# Patient Record
Sex: Male | Born: 1975 | Race: Black or African American | Marital: Married | Smoking: Never smoker
Health system: Southern US, Community
[De-identification: ages and names within clinical notes are randomized; demographics above are authoritative.]

---

## 2012-06-18 ENCOUNTER — Ambulatory Visit
Admission: RE | Admit: 2012-06-18 | Discharge: 2012-06-18 | Disposition: A | Discharge status: Home / Self Care | Payer: No Typology Code available for payment source | Source: Ambulatory Visit | Attending: Infectious Diseases | Admitting: Infectious Diseases

## 2012-06-18 ENCOUNTER — Other Ambulatory Visit: Payer: Self-pay | Admitting: Infectious Diseases

## 2012-06-18 DIAGNOSIS — R7611 Nonspecific reaction to tuberculin skin test without active tuberculosis: Secondary | ICD-10-CM

## 2015-12-06 ENCOUNTER — Ambulatory Visit: Payer: Self-pay

## 2015-12-07 ENCOUNTER — Ambulatory Visit (INDEPENDENT_AMBULATORY_CARE_PROVIDER_SITE_OTHER): Payer: BLUE CROSS/BLUE SHIELD | Admitting: Family Medicine

## 2015-12-07 VITALS — BP 122/72 | HR 74 | Temp 98.4°F | Resp 17 | Ht 72.0 in | Wt 159.0 lb

## 2015-12-07 DIAGNOSIS — J4521 Mild intermittent asthma with (acute) exacerbation: Secondary | ICD-10-CM

## 2015-12-07 MED ORDER — PREDNISONE 20 MG PO TABS: ORAL_TABLET | ORAL | 1 refills | Status: DC

## 2015-12-07 MED ORDER — ALBUTEROL SULFATE HFA 108 (90 BASE) MCG/ACT IN AERS: 2.0000 | INHALATION_SPRAY | RESPIRATORY_TRACT | 6 refills | Status: DC | PRN

## 2015-12-07 NOTE — Progress Notes (Signed)
This is a 40 year old man who comes in with tightness in his chest for 3 days. In the past he's had this kind of tightness this lasted an hour to put all resolves in short order. He's never been diagnosed with asthma and he does not smoke. He has not family history of asthma.    associated with a tightness in his chest is sneezing, sinus congestion, and itching in his eyes.  Patient denies any hemoptysis or fever.  Patient [place]  Objective:BP 122/72 (BP Location: Right Arm, Patient Position: Sitting, Cuff Size: Normal)   Pulse 74   Temp 98.4 F (36.9 C) (Oral)   Resp 17   Ht 6' (1.829 m)   Wt 159 lb (72.1 kg)   SpO2 95%   BMI 21.56 kg/m  No acute distress Sinus sounding voice HEENT: Unremarkable Chest: Bilateral expiratory and inspiratory wheezes. Heart: Regular no murmur Skin: Unremarkable  Assessment: Asthma, new onset. Mild and intermittent  Plan:  Asthma, mild intermittent, with acute exacerbation - Plan: predniSONE (DELTASONE) 20 MG tablet, albuterol (PROVENTIL HFA;VENTOLIN HFA) 108 (90 Base) MCG/ACT inhaler  Elvina SidleKurt Alesia Oshields, MD

## 2015-12-07 NOTE — Patient Instructions (Addendum)
   IF you received an x-ray today, you will receive an invoice from [facility]. Please contact [facility] at [phone] with questions or concerns regarding your invoice.   IF you received labwork today, you will receive an invoice from [facility]. Please contact [facility] at [phone] with questions or concerns regarding your invoice.   Our billing staff will not be able to assist you with questions regarding bills from these companies.  You will be contacted with the lab results as soon as they are available. The fastest way to get your results is to activate your My Chart account. Instructions are located on the last page of this paperwork. If you have not heard from us regarding the results in 2 weeks, please contact this office.     Asthma Attack Prevention While you may not be able to control the fact that you have asthma, you can take actions to prevent asthma attacks. The best way to prevent asthma attacks is to maintain good control of your asthma. You can achieve this by:  Taking your medicines as directed.  Avoiding things that can irritate your airways or make your asthma symptoms worse (asthma triggers).  Keeping track of how well your asthma is controlled and of any changes in your symptoms.  Responding quickly to worsening asthma symptoms (asthma attack).  Seeking emergency care when it is needed. WHAT ARE SOME WAYS TO PREVENT AN ASTHMA ATTACK? Have a Plan Work with your health care provider to create a written plan for managing and treating your asthma attacks (asthma action plan). This plan includes:  A list of your asthma triggers and how you can avoid them.  Information on when medicines should be taken and when their dosages should be changed.  The use of a device that measures how well your lungs are working (peak flow meter). Monitor Your Asthma Use your peak flow meter and record your results in a journal  every day. A drop in your peak flow numbers on one or more days may indicate the start of an asthma attack. This can happen even before you start to feel symptoms. You can prevent an asthma attack from getting worse by following the steps in your asthma action plan. Avoid Asthma Triggers Work with your asthma health care provider to find out what your asthma triggers are. This can be done by:  Allergy testing.  Keeping a journal that notes when asthma attacks occur and the factors that may have contributed to them.  Determining if there are other medical conditions that are making your asthma worse. Once you have determined your asthma triggers, take steps to avoid them. This may include avoiding excessive or prolonged exposure to:  Dust. Have someone dust and vacuum your home for you once or twice a week. Using a high-efficiency particulate arrestance (HEPA) vacuum is best.  Smoke. This includes campfire smoke, forest fire smoke, and secondhand smoke from tobacco products.  Pet dander. Avoid contact with animals that you know you are allergic to.  Allergens from trees, grasses or pollens. Avoid spending a lot of time outdoors when pollen counts are high, and on very windy days.  Very cold, dry, or humid air.  Mold.  Foods that contain high amounts of sulfites.  Strong odors.  Outdoor air pollutants, such as engine exhaust.  Indoor air pollutants, such as aerosol sprays and fumes from household cleaners.  Household pests, including dust mites and cockroaches, and pest droppings.  Certain medicines, including   NSAIDs. Always talk to your health care provider before stopping or starting any new medicines. Medicines Take over-the-counter and prescription medicines only as told by your health care provider. Many asthma attacks can be prevented by carefully following your medicine schedule. Taking your medicines correctly is especially important when you cannot avoid certain asthma  triggers. Act Quickly If an asthma attack does happen, acting quickly can decrease how severe it is and how long it lasts. Take these steps:   Pay attention to your symptoms. If you are coughing, wheezing, or having difficulty breathing, do not wait to see if your symptoms go away on their own. Follow your asthma action plan.  If you have followed your asthma action plan and your symptoms are not improving, call your health care provider or seek immediate medical care at the nearest hospital. It is important to note how often you need to use your fast-acting rescue inhaler. If you are using your rescue inhaler more often, it may mean that your asthma is not under control. Adjusting your asthma treatment plan may help you to prevent future asthma attacks and help you to gain better control of your condition. HOW CAN I PREVENT AN ASTHMA ATTACK WHEN I EXERCISE? Follow advice from your health care provider about whether you should use your fast-acting inhaler before exercising. Many people with asthma experience exercise-induced bronchoconstriction (EIB). This condition often worsens during vigorous exercise in cold, humid, or dry environments. Usually, people with EIB can stay very active by pre-treating with a fast-acting inhaler before exercising.   This information is not intended to replace advice given to you by your health care provider. Make sure you discuss any questions you have with your health care provider.   Document Released: 02/15/2009 Document Revised: 11/18/2014 Document Reviewed: 07/30/2014 Elsevier Interactive Patient Education 2016 Elsevier Inc.  

## 2016-01-16 ENCOUNTER — Other Ambulatory Visit: Payer: Self-pay | Admitting: Family Medicine

## 2016-01-16 DIAGNOSIS — J4521 Mild intermittent asthma with (acute) exacerbation: Secondary | ICD-10-CM

## 2016-01-24 ENCOUNTER — Ambulatory Visit (INDEPENDENT_AMBULATORY_CARE_PROVIDER_SITE_OTHER): Payer: BLUE CROSS/BLUE SHIELD | Admitting: Family Medicine

## 2016-01-24 ENCOUNTER — Ambulatory Visit (INDEPENDENT_AMBULATORY_CARE_PROVIDER_SITE_OTHER): Payer: BLUE CROSS/BLUE SHIELD

## 2016-01-24 VITALS — BP 142/84 | HR 80 | Temp 98.3°F | Resp 16 | Ht 72.0 in | Wt 161.8 lb

## 2016-01-24 DIAGNOSIS — J029 Acute pharyngitis, unspecified: Secondary | ICD-10-CM | POA: Diagnosis not present

## 2016-01-24 DIAGNOSIS — R0602 Shortness of breath: Secondary | ICD-10-CM

## 2016-01-24 DIAGNOSIS — R079 Chest pain, unspecified: Secondary | ICD-10-CM

## 2016-01-24 DIAGNOSIS — J209 Acute bronchitis, unspecified: Secondary | ICD-10-CM

## 2016-01-24 DIAGNOSIS — J4521 Mild intermittent asthma with (acute) exacerbation: Secondary | ICD-10-CM | POA: Diagnosis not present

## 2016-01-24 LAB — POCT CBC
GRANULOCYTE PERCENT: 59.8 % (ref 37–80)
HCT, POC: 45.2 % (ref 43.5–53.7)
Hemoglobin: 16.3 g/dL (ref 14.1–18.1)
Lymph, poc: 1.6 (ref 0.6–3.4)
MCH: 32.3 pg — AB (ref 27–31.2)
MCHC: 36.2 g/dL — AB (ref 31.8–35.4)
MCV: 89.2 fL (ref 80–97)
MID (CBC): 0.4 (ref 0–0.9)
MPV: 6.1 fL (ref 0–99.8)
PLATELET COUNT, POC: 223 10*3/uL (ref 142–424)
POC Granulocyte: 3.1 (ref 2–6.9)
POC LYMPH PERCENT: 31.6 %L (ref 10–50)
POC MID %: 8.6 % (ref 0–12)
RBC: 5.06 M/uL (ref 4.69–6.13)
RDW, POC: 12 %
WBC: 5.2 10*3/uL (ref 4.6–10.2)

## 2016-01-24 LAB — POCT RAPID STREP A (OFFICE): Rapid Strep A Screen: NEGATIVE

## 2016-01-24 LAB — POCT SEDIMENTATION RATE: POCT SED RATE: 25 mm/hr — AB (ref 0–22)

## 2016-01-24 MED ORDER — PREDNISONE 50 MG PO TABS: 50.0000 mg | ORAL_TABLET | Freq: Every day | ORAL | 0 refills | Status: DC

## 2016-01-24 MED ORDER — ALBUTEROL SULFATE (2.5 MG/3ML) 0.083% IN NEBU
2.5000 mg | INHALATION_SOLUTION | Freq: Once | RESPIRATORY_TRACT | Status: AC
  Administered 2016-01-24: 2.5 mg via RESPIRATORY_TRACT

## 2016-01-24 MED ORDER — AZITHROMYCIN 250 MG PO TABS: ORAL_TABLET | ORAL | 0 refills | Status: DC

## 2016-01-24 MED ORDER — ALBUTEROL SULFATE 108 (90 BASE) MCG/ACT IN AEPB: 2.0000 | INHALATION_SPRAY | RESPIRATORY_TRACT | 2 refills | Status: DC | PRN

## 2016-01-24 NOTE — Progress Notes (Signed)
Subjective:  By signing my name below, I, Jerry Sanchez, attest that this documentation has been prepared under the direction and in the presence of Jerry SorensonEva Shaw, MD Electronically Signed: Charline BillsEssence Sanchez, ED Scribe 01/24/2016 at 10:32 AM.   Patient ID: Jerry MatteInnocent Sanchez, male    DOB: Dec 21, 1975, 40 y.o.   MRN: [id number]  Chief Complaint  Patient presents with  . Cough    last night    HPI HPI Comments: Jerry Sanchez is a 40 y.o. male who presents to the [facility] complaining of dry cough onset last night. Pt was seen here 6 weeks prior for chest tightness. No prior or family h/o asthma. No h/o tobacco use. Diagnosed with mild intermittent asthma. Treated with prednisone and albuterol.   Today, pt reports dry cough onset last night. Pt reports associated symptoms of wheezing, left-sided and central chest tightness that is exacerbated with coughing. He states "I feel like I can't get a deep breath". No treatments tried PTA. Pt states that he never got the albuterol inhaler filled in September and that the Prednisone only provided temporary relief. He denies fever, chills, nasal congestion, sore throat, nausea, diaphoresis. Pt also denies smoking in his home.   No past medical history on file. Current Outpatient Prescriptions on File Prior to Visit  Medication Sig Dispense Refill  . albuterol (PROVENTIL HFA;VENTOLIN HFA) 108 (90 Base) MCG/ACT inhaler Inhale 2 puffs into the lungs every 4 (four) hours as needed for wheezing or shortness of breath (cough, shortness of breath or wheezing.). (Patient not taking: Reported on 01/24/2016) 1 Inhaler 6  . predniSONE (DELTASONE) 20 MG tablet Two daily with food (Patient not taking: Reported on 01/24/2016) 10 tablet 1   No current facility-administered medications on file prior to visit.    Not on File  Review of Systems  Constitutional: Negative for chills, diaphoresis and fever.  HENT: Negative for congestion and sore throat.     Respiratory: Positive for cough, chest tightness and wheezing.   Gastrointestinal: Negative for nausea.   BP (!) 142/84 (BP Location: Right Arm, Patient Position: Sitting, Cuff Size: Small)   Pulse 80   Temp 98.3 F (36.8 C) (Oral)   Resp 16   Ht 6' (1.829 m)   Wt 161 lb 12.8 oz (73.4 kg)   SpO2 96%   BMI 21.94 kg/m     Objective:   Physical Exam  Constitutional: He is oriented to person, place, and time. He appears well-developed and well-nourished. No distress.  HENT:  Head: Normocephalic and atraumatic.  Right Ear: Tympanic membrane normal.  Left Ear: Tympanic membrane normal.  Nose: Nose normal.  Mouth/Throat: Posterior oropharyngeal erythema present.  Petechiae on soft palate  Eyes: Conjunctivae and EOM are normal.  Neck: Neck supple. No tracheal deviation present.  Cardiovascular: Normal rate, regular rhythm, S1 normal, S2 normal and normal heart sounds.   Pulmonary/Chest: Effort normal. No respiratory distress. He has wheezes.  Very poor air movement. Very restricted with expiratory wheeze worse at R lower lobe.   Air movement significantly improved and lungs cleared after albuterol treatment.   Musculoskeletal: Normal range of motion.  Lymphadenopathy:    He has no cervical adenopathy.  Neurological: He is alert and oriented to person, place, and time.  Skin: Skin is warm and dry.  Psychiatric: He has a normal mood and affect. His behavior is normal.  Nursing note and vitals reviewed.  EKG: Normal sinus rhythm. RR prime throughout all chest leads. S wave present  in G3 and aVF.   Dg Chest 2 View  Result Date: 01/24/2016 CLINICAL DATA:  Cough, shortness of breath beginning last night. EXAM: CHEST  2 VIEW COMPARISON:  06/18/2012 FINDINGS: There is peribronchial thickening. Heart and mediastinal contours are within normal limits. No focal opacities or effusions. No acute bony abnormality. IMPRESSION: Bronchitic changes. Electronically Signed   By: Charlett NoseKevin  Dover M.D.    On: 01/24/2016 10:59   Results for orders placed or performed in visit on 01/24/16  POCT CBC  Result Value Ref Range   WBC 5.2 4.6 - 10.2 K/uL   Lymph, poc 1.6 0.6 - 3.4   POC LYMPH PERCENT 31.6 10 - 50 %L   MID (cbc) 0.4 0 - 0.9   POC MID % 8.6 0 - 12 %M   POC Granulocyte 3.1 2 - 6.9   Granulocyte percent 59.8 37 - 80 %G   RBC 5.06 4.69 - 6.13 M/uL   Hemoglobin 16.3 14.1 - 18.1 g/dL   HCT, POC 16.145.2 09.643.5 - 53.7 %   MCV 89.2 80 - 97 fL   MCH, POC 32.3 (A) 27 - 31.2 pg   MCHC 36.2 (A) 31.8 - 35.4 g/dL   RDW, POC 04.512.0 %   Platelet Count, POC 223 142 - 424 K/uL   MPV 6.1 0 - 99.8 fL  POCT rapid strep A  Result Value Ref Range   Rapid Strep A Screen Negative Negative      Assessment & Plan:   1. Chest pain, unspecified type   2. Shortness of breath   3. Pharyngitis, unspecified etiology   4. Acute bronchitis, unspecified organism   5. Mild intermittent reactive airway disease with acute exacerbation    Exam and sxs all resolved/normal after albuterol neb in office.  Spent >10 min teaching pt how to use albuterol inhaler - all questions answered but pt still wary.  Advised that he is welcome to RTC for nurse-only teaching visit to ensure he is using albuterol correctly if he would like.  Reminded pt that he may have cough for up to 3 wks but if he cont to have occ ShoB/wheezing then should RTC  For further eval with pre- and post-spirometry and/or consider pulmonology referral.  Orders Placed This Encounter  Procedures  . Culture, Group A Strep    Order Specific Question:   Source    Answer:   oropharynx  . DG Chest 2 View    Standing Status:   Future    Number of Occurrences:   1    Standing Expiration Date:   01/23/2017    Order Specific Question:   Reason for Exam (SYMPTOM  OR DIAGNOSIS REQUIRED)    Answer:   wheezing    Order Specific Question:   Preferred imaging location?    Answer:   External  . POCT CBC  . POCT SEDIMENTATION RATE  . POCT rapid strep A  . EKG  12-Lead    Meds ordered this encounter  Medications  . albuterol (PROVENTIL) (2.5 MG/3ML) 0.083% nebulizer solution 2.5 mg  . Albuterol Sulfate (PROAIR RESPICLICK) 108 (90 Base) MCG/ACT AEPB    Sig: Inhale 2 puffs into the lungs every 4 (four) hours as needed.    Dispense:  1 each    Refill:  2  . azithromycin (ZITHROMAX) 250 MG tablet    Sig: Take 2 tabs PO x 1 dose, then 1 tab PO QD x 4 days    Dispense:  6 tablet  Refill:  0  . predniSONE (DELTASONE) 50 MG tablet    Sig: Take 1 tablet (50 mg total) by mouth daily.    Dispense:  5 tablet    Refill:  0    I personally performed the services described in this documentation, which was scribed in my presence. The recorded information has been reviewed and considered, and addended by me as needed.   Jerry Sanchez, M.D.  [facility] [address] [place], [place] [address] [phone] phone [phone] fax  01/24/16 11:46 AM

## 2016-01-24 NOTE — Patient Instructions (Addendum)
   IF you received an x-ray today, you will receive an invoice from [facility]. Please contact  Radiology at [phone] with questions or concerns regarding your invoice.   IF you received labwork today, you will receive an invoice from [facility]. Please contact [facility] at [phone] with questions or concerns regarding your invoice.   Our billing staff will not be able to assist you with questions regarding bills from these companies.  You will be contacted with the lab results as soon as they are available. The fastest way to get your results is to activate your My Chart account. Instructions are located on the last page of this paperwork. If you have not heard from us regarding the results in 2 weeks, please contact this office.    Acute Bronchitis Bronchitis is inflammation of the airways that extend from the windpipe into the lungs (bronchi). The inflammation often causes mucus to develop. This leads to a cough, which is the most common symptom of bronchitis.  In acute bronchitis, the condition usually develops suddenly and goes away over time, usually in a couple weeks. Smoking, allergies, and asthma can make bronchitis worse. Repeated episodes of bronchitis may cause further lung problems.  CAUSES Acute bronchitis is most often caused by the same virus that causes a cold. The virus can spread from person to person (contagious) through coughing, sneezing, and touching contaminated objects. SIGNS AND SYMPTOMS   Cough.   Fever.   Coughing up mucus.   Body aches.   Chest congestion.   Chills.   Shortness of breath.   Sore throat.  DIAGNOSIS  Acute bronchitis is usually diagnosed through a physical exam. Your health care provider will also ask you questions about your medical history. Tests, such as chest X-rays, are sometimes done to rule out other conditions.  TREATMENT  Acute bronchitis usually goes away in a  couple weeks. Oftentimes, no medical treatment is necessary. Medicines are sometimes given for relief of fever or cough. Antibiotic medicines are usually not needed but may be prescribed in certain situations. In some cases, an inhaler may be recommended to help reduce shortness of breath and control the cough. A cool mist vaporizer may also be used to help thin bronchial secretions and make it easier to clear the chest.  HOME CARE INSTRUCTIONS  Get plenty of rest.   Drink enough fluids to keep your urine clear or pale yellow (unless you have a medical condition that requires fluid restriction). Increasing fluids may help thin your respiratory secretions (sputum) and reduce chest congestion, and it will prevent dehydration.   Take medicines only as directed by your health care provider.  If you were prescribed an antibiotic medicine, finish it all even if you start to feel better.  Avoid smoking and secondhand smoke. Exposure to cigarette smoke or irritating chemicals will make bronchitis worse. If you are a smoker, consider using nicotine gum or skin patches to help control withdrawal symptoms. Quitting smoking will help your lungs heal faster.   Reduce the chances of another bout of acute bronchitis by washing your hands frequently, avoiding people with cold symptoms, and trying not to touch your hands to your mouth, nose, or eyes.   Keep all follow-up visits as directed by your health care provider.  SEEK MEDICAL CARE IF: Your symptoms do not improve after 1 week of treatment.  SEEK IMMEDIATE MEDICAL CARE IF:  You develop an increased fever or chills.   You have chest pain.     You have severe shortness of breath.  You have bloody sputum.   You develop dehydration.  You faint or repeatedly feel like you are going to pass out.  You develop repeated vomiting.  You develop a severe headache. MAKE SURE YOU:   Understand these instructions.  Will watch your  condition.  Will get help right away if you are not doing well or get worse.   This information is not intended to replace advice given to you by your health care provider. Make sure you discuss any questions you have with your health care provider.   Document Released: 04/06/2004 Document Revised: 03/20/2014 Document Reviewed: 08/20/2012 Elsevier Interactive Patient Education 2016 Elsevier Inc.  

## 2016-01-25 LAB — CULTURE, GROUP A STREP: ORGANISM ID, BACTERIA: NORMAL

## 2017-01-04 IMAGING — DX DG CHEST 2V
2 series · 2 of 2 positions shown · non-contrast
Comparison: 06/18/2012

CLINICAL DATA: Cough, shortness of breath beginning last night.

EXAM:
CHEST  2 VIEW

[chest pa]
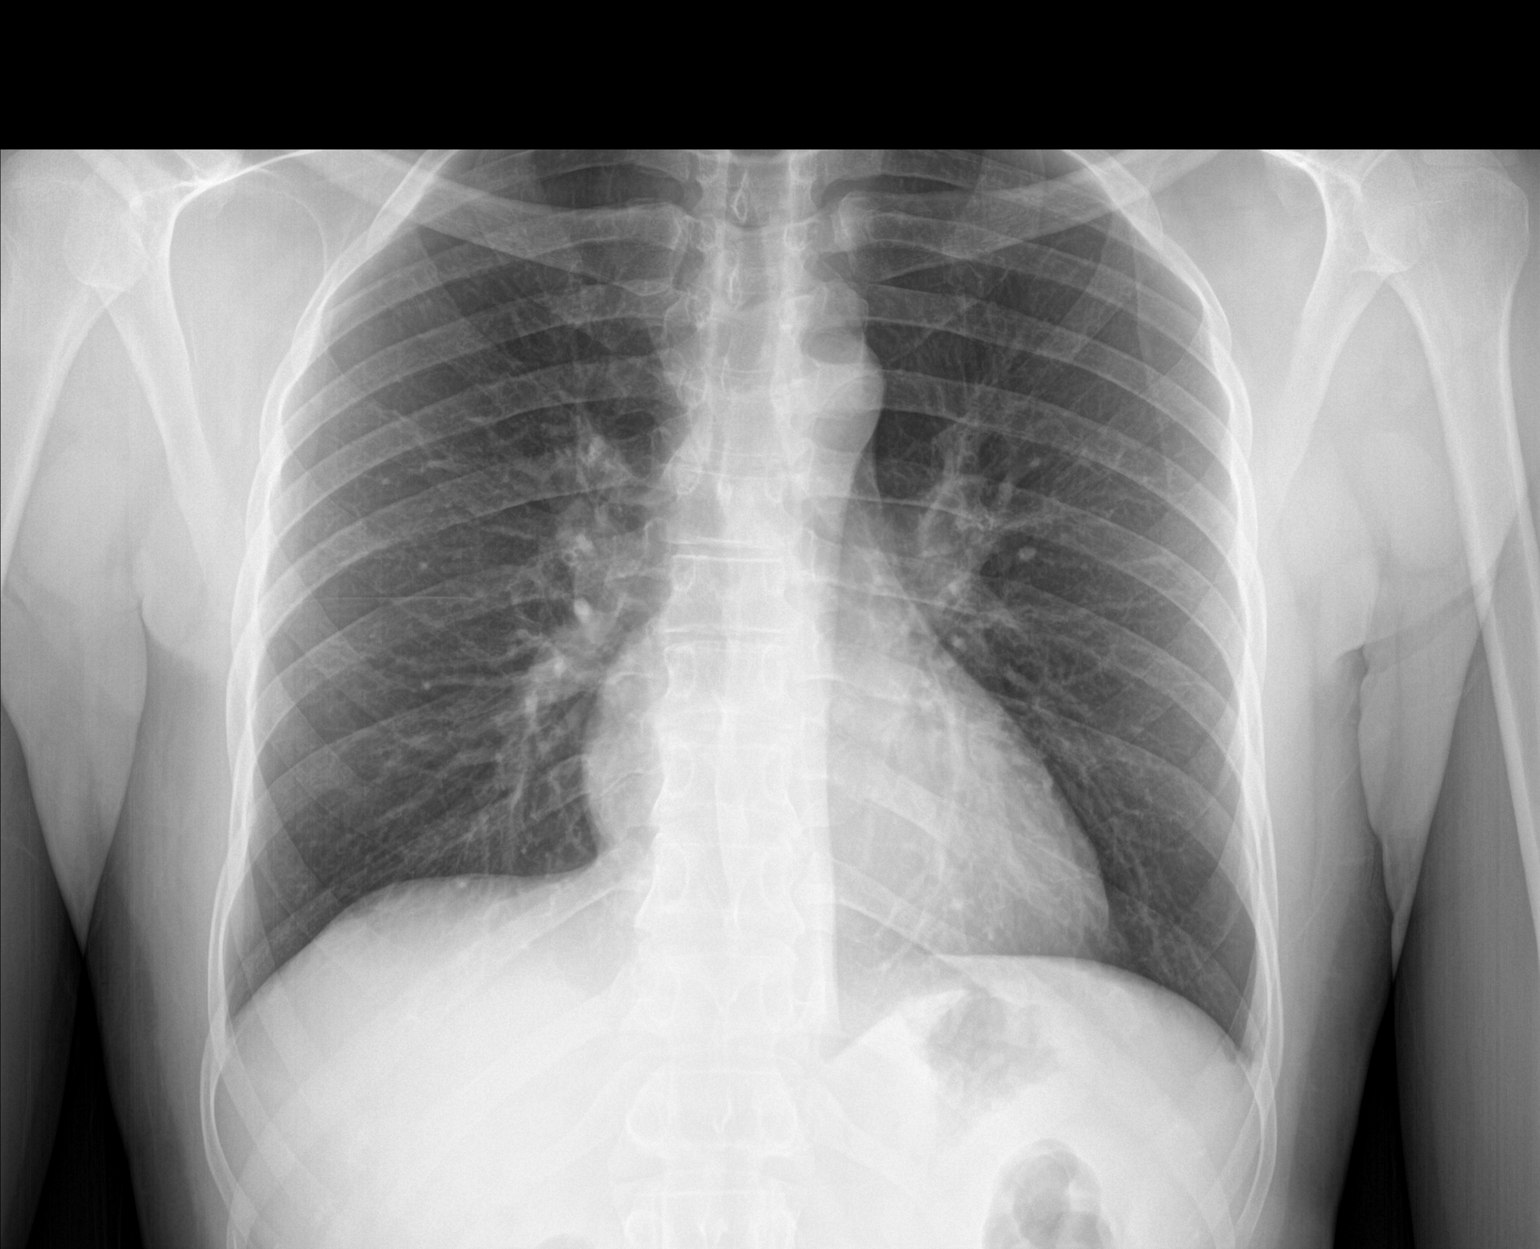

[chest lat]
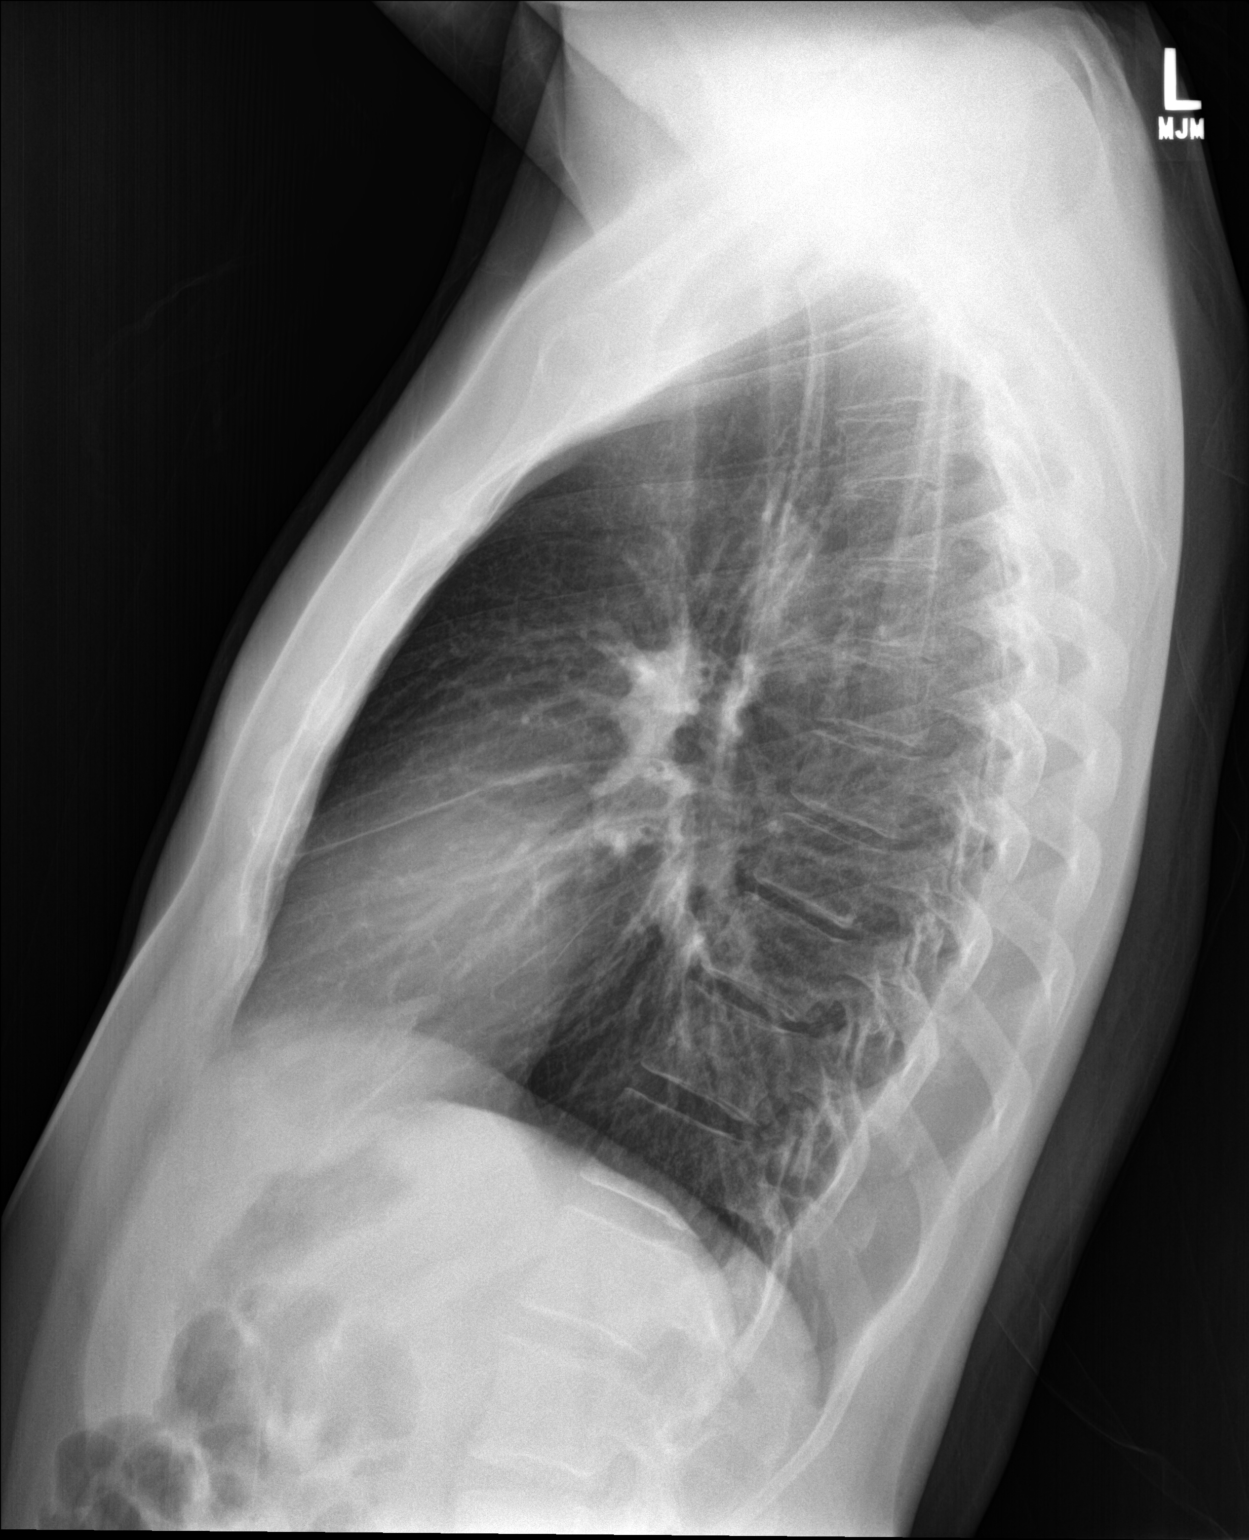

[2 of 2 positions shown; findings below may reference images not displayed]

FINDINGS: There is peribronchial thickening. Heart and mediastinal contours
are within normal limits. No focal opacities or effusions. No acute
bony abnormality.
IMPRESSION: Bronchitic changes.

## 2017-06-29 ENCOUNTER — Other Ambulatory Visit: Payer: Self-pay

## 2017-06-29 ENCOUNTER — Ambulatory Visit: Payer: BC Managed Care – PPO | Admitting: Physician Assistant

## 2017-06-29 ENCOUNTER — Encounter: Payer: Self-pay | Admitting: Physician Assistant

## 2017-06-29 VITALS — BP 108/62 | HR 90 | Temp 98.6°F | Resp 16 | Ht 74.0 in | Wt 172.8 lb

## 2017-06-29 DIAGNOSIS — R079 Chest pain, unspecified: Secondary | ICD-10-CM | POA: Diagnosis not present

## 2017-06-29 MED ORDER — ALBUTEROL SULFATE 108 (90 BASE) MCG/ACT IN AEPB: 2.0000 | INHALATION_SPRAY | RESPIRATORY_TRACT | 2 refills | Status: DC | PRN

## 2017-06-29 NOTE — Progress Notes (Signed)
Patient ID: Jerry Sanchez, male    DOB: 1975-10-19, 42 y.o.   MRN: [id number]  PCP: Patient, No Pcp Per  Chief Complaint  Patient presents with  . Chest Pain    started last year and started a week ago     Subjective:   Presents for evaluation of chest pain.  This pain occurs at night, and is associated with sensation that he cannot take a deep breath. Intermittent, doesn't occur every night. Symptoms x 2 weeks.  He had similar symptoms in fall 2017. Notes 11/2015 and 01/2016 reviewed.  He was initially diagnosed with asthma and prescribed an oral steroid taper and albuterol inhaler. Upon return, he reported no improvement with the prednisone and had not used the albuterol.  In the office, albuterol neb resolved the symptoms, and he was educated on inhaler use, advised to use it, and prescribed azithromycin. His symptoms resolved until 2 weeks ago.  Not a smoker, ever. No nasal or sinus symptoms. No sore throat, ear popping or pain. No fever, chills. No GI/GU symptoms.    Review of Systems Constitutional: Negative for chills, diaphoresis, fatigue and fever.  HENT: Negative for congestion, postnasal drip, rhinorrhea, sinus pressure, sinus pain, sneezing, sore throat and voice change.   Eyes: Negative for pain, discharge and itching.  Respiratory: Positive for chest tightness and wheezing (Only PM). Negative for cough, choking, shortness of breath and stridor.   Cardiovascular: Positive for chest pain (intermittent). Negative for palpitations and leg swelling.  Gastrointestinal: Negative.   Endocrine: Negative.   Genitourinary: Negative.   Musculoskeletal: Negative.   Skin: Negative.   Allergic/Immunologic: Positive for environmental allergies.  Neurological: Negative.  Negative for dizziness and light-headedness.  Hematological: Negative.   Psychiatric/Behavioral: Negative.  The patient is not nervous/anxious.        There are no active problems to display for this  patient.    Prior to Admission medications   Medication Sig Start Date End Date Taking? Authorizing Provider  Albuterol Sulfate (PROAIR RESPICLICK) 108 (90 Base) MCG/ACT AEPB Inhale 2 puffs into the lungs every 4 (four) hours as needed. Patient not taking: Reported on 06/29/2017 01/24/16   Sherren MochaShaw, Eva N, MD  azithromycin (ZITHROMAX) 250 MG tablet Take 2 tabs PO x 1 dose, then 1 tab PO QD x 4 days Patient not taking: Reported on 06/29/2017 01/24/16   Sherren MochaShaw, Eva N, MD  predniSONE (DELTASONE) 50 MG tablet Take 1 tablet (50 mg total) by mouth daily. Patient not taking: Reported on 06/29/2017 01/24/16   Sherren MochaShaw, Eva N, MD     No Known Allergies     Objective:  Physical Exam  Constitutional: He is oriented to person, place, and time. He appears well-developed and well-nourished. He is active and cooperative. No distress.  BP 108/62   Pulse 90   Temp 98.6 F (37 C)   Resp 16   Ht 6\' 2"  (1.88 m)   Wt 172 lb 12.8 oz (78.4 kg)   SpO2 96%   BMI 22.19 kg/m   HENT:  Head: Normocephalic and atraumatic.  Right Ear: Hearing, tympanic membrane, external ear and ear canal normal.  Left Ear: Hearing, tympanic membrane, external ear and ear canal normal.  Nose: Nose normal.  Eyes: Conjunctivae and lids are normal. No scleral icterus.  Neck: Normal range of motion and phonation normal. Neck supple. No thyromegaly present.  Cardiovascular: Normal rate, regular rhythm and normal heart sounds.  Pulses:      Radial pulses are 2+  on the right side, and 2+ on the left side.  Pulmonary/Chest: Effort normal and breath sounds normal.  Abdominal: Soft. Normal appearance and bowel sounds are normal. There is no tenderness.  Lymphadenopathy:       Head (right side): No tonsillar, no preauricular, no posterior auricular and no occipital adenopathy present.       Head (left side): No tonsillar, no preauricular, no posterior auricular and no occipital adenopathy present.    He has no cervical adenopathy.        Right: No supraclavicular adenopathy present.       Left: No supraclavicular adenopathy present.  Neurological: He is alert and oriented to person, place, and time. No sensory deficit.  Skin: Skin is warm, dry and intact. No rash noted. No cyanosis or erythema. Nails show no clubbing.  Psychiatric: He has a normal mood and affect. His speech is normal and behavior is normal.           Assessment & Plan:   1. Chest pain, unspecified type No evidence of bacterial infection. Previous evaluation found no other cause for symptoms, which resolved in the office with albuterol neb treatment. Resume albuterol, and re-evaluate in 4 weeks. If needing it regularly, would recommend inhaled steroid. - Albuterol Sulfate (PROAIR RESPICLICK) 108 (90 Base) MCG/ACT AEPB; Inhale 2 puffs into the lungs every 4 (four) hours as needed.  Dispense: 1 each; Refill: 2    Return in about 1 month (around 07/27/2017), or if symptoms worsen or fail to improve, for re-evalaution of chest pain.   Fernande Bras, PA-C [facility]

## 2017-06-29 NOTE — Progress Notes (Signed)
Subjective:    Patient ID: Jerry Sanchez, male    DOB: 02/07/1976, 42 y.o.   MRN: [id number] Chief Complaint  Patient presents with  . Chest Pain    started last year and started a week ago     HPI  Jerry Sanchez, 42 yo male presents today for 1 week of intermittent pain with inspiration. Chest pain is central, hurts only with a deep breath and is described as a chest tightness feeling. Pain started 1 week ago. Wheezing and pain only occurs mostly at night.  Denies chest pain at rest, jaw pain, arm pain, SOB, cough, fevers, chills. Denies chest pain with movement or exertion.  Denies smoking, hemoptysis.  Pt reports coming here last year with the same complaint and was diagnosed with asthma. Reports he has the same pain today as last year. Upon chart review, it appears patient has had asthma attacks in the past (01/2016) and was treated with nebulizer in office, with relief.   Review of Systems  Constitutional: Negative for chills, diaphoresis, fatigue and fever.  HENT: Negative for congestion, postnasal drip, rhinorrhea, sinus pressure, sinus pain, sneezing, sore throat and voice change.   Eyes: Negative for pain, discharge and itching.  Respiratory: Positive for chest tightness and wheezing (Only PM). Negative for cough, choking, shortness of breath and stridor.   Cardiovascular: Positive for chest pain (intermittent). Negative for palpitations and leg swelling.  Gastrointestinal: Negative.   Endocrine: Negative.   Genitourinary: Negative.   Musculoskeletal: Negative.   Skin: Negative.   Allergic/Immunologic: Positive for environmental allergies.  Neurological: Negative.  Negative for dizziness and light-headedness.  Hematological: Negative.   Psychiatric/Behavioral: Negative.  The patient is not nervous/anxious.     There are no active problems to display for this patient.   History reviewed. No pertinent past medical history.  Prior to Admission medications   Medication  Sig Start Date End Date Taking? Authorizing Provider  Albuterol Sulfate (PROAIR RESPICLICK) 108 (90 Base) MCG/ACT AEPB Inhale 2 puffs into the lungs every 4 (four) hours as needed. Patient not taking: Reported on 06/29/2017 01/24/16   Sherren Mocha, MD    No Known Allergies     Objective:   Physical Exam  Constitutional: He is oriented to person, place, and time. He appears well-developed and well-nourished. No distress.  BP 108/62   Pulse 90   Temp 98.6 F (37 C)   Resp 16   Ht 6\' 2"  (1.88 m)   Wt 172 lb 12.8 oz (78.4 kg)   SpO2 96%   BMI 22.19 kg/m    HENT:  Head: Normocephalic and atraumatic.  Nose: Nose normal.  Eyes: Pupils are equal, round, and reactive to light. Conjunctivae and EOM are normal. Right eye exhibits no discharge. Left eye exhibits no discharge.  Neck: Normal range of motion. Neck supple. No tracheal deviation present. No thyromegaly present.  Cardiovascular: Normal rate, regular rhythm and intact distal pulses. Exam reveals no gallop and no friction rub.  No murmur heard. Pulses:      Radial pulses are 2+ on the right side, and 2+ on the left side.       Dorsalis pedis pulses are 2+ on the right side, and 2+ on the left side.       Posterior tibial pulses are 2+ on the right side, and 2+ on the left side.  Pulmonary/Chest: Effort normal and breath sounds normal. No respiratory distress. He has no wheezes. He has no rales.  Musculoskeletal: Normal range  of motion. He exhibits no edema.  Neurological: He is alert and oriented to person, place, and time. He has normal reflexes.  Skin: Skin is warm and dry. He is not diaphoretic. No erythema.  Psychiatric: He has a normal mood and affect. His behavior is normal.          Assessment & Plan:  Chest pain, unspecified type - Plan: Albuterol Sulfate (PROAIR RESPICLICK) 108 (90 Base) MCG/ACT AEPB informed to return if symptoms worsen or do not get better. Only 1 albuterol prescribed, explained to patient would  like to see him back if he is using inhaler too often. Unlikely bacterial or infectious in nature.   Return in about 1 month (around 07/27/2017), or if symptoms worsen or fail to improve, for re-evalaution of chest pain.

## 2017-06-29 NOTE — Patient Instructions (Addendum)
Use the albuterol inhaler 2 puffs every 4-6 hours as needed for cough, shortness of breath, wheezing, chest tightness.    IF you received an x-ray today, you will receive an invoice from [facility]. Please contact [facility] at [phone] with questions or concerns regarding your invoice.   IF you received labwork today, you will receive an invoice from [facility]. Please contact [facility] at [phone] with questions or concerns regarding your invoice.   Our billing staff will not be able to assist you with questions regarding bills from these companies.  You will be contacted with the lab results as soon as they are available. The fastest way to get your results is to activate your My Chart account. Instructions are located on the last page of this paperwork. If you have not heard from us regarding the results in 2 weeks, please contact this office.

## 2020-02-28 ENCOUNTER — Encounter (HOSPITAL_COMMUNITY): Payer: Self-pay | Admitting: Emergency Medicine

## 2020-02-28 ENCOUNTER — Emergency Department (HOSPITAL_COMMUNITY)
Admission: EM | Admit: 2020-02-28 | Discharge: 2020-02-29 | Disposition: A | Discharge status: Home / Self Care | Payer: BC Managed Care – PPO | Attending: Emergency Medicine | Admitting: Emergency Medicine

## 2020-02-28 ENCOUNTER — Other Ambulatory Visit: Payer: Self-pay

## 2020-02-28 DIAGNOSIS — S81851A Open bite, right lower leg, initial encounter: Secondary | ICD-10-CM | POA: Diagnosis present

## 2020-02-28 DIAGNOSIS — Z23 Encounter for immunization: Secondary | ICD-10-CM | POA: Insufficient documentation

## 2020-02-28 DIAGNOSIS — Z2914 Encounter for prophylactic rabies immune globin: Secondary | ICD-10-CM | POA: Diagnosis not present

## 2020-02-28 DIAGNOSIS — W540XXA Bitten by dog, initial encounter: Secondary | ICD-10-CM | POA: Diagnosis not present

## 2020-02-28 DIAGNOSIS — Y9301 Activity, walking, marching and hiking: Secondary | ICD-10-CM | POA: Insufficient documentation

## 2020-02-28 NOTE — ED Triage Notes (Addendum)
Pt reports dog bite to R lower leg.  States he was walking and unknown dog bit him just PTA.  Bleeding controlled.

## 2020-02-29 MED ORDER — AMOXICILLIN-POT CLAVULANATE 875-125 MG PO TABS: 1.0000 | ORAL_TABLET | Freq: Two times a day (BID) | ORAL | 0 refills | Status: AC

## 2020-02-29 MED ORDER — AMOXICILLIN-POT CLAVULANATE 875-125 MG PO TABS
1.0000 | ORAL_TABLET | Freq: Once | ORAL | Status: AC
Start: 2020-02-29 — End: 2020-02-29
  Administered 2020-02-29: 1 via ORAL
  Filled 2020-02-29: qty 1

## 2020-02-29 MED ORDER — TETANUS-DIPHTH-ACELL PERTUSSIS 5-2.5-18.5 LF-MCG/0.5 IM SUSY
0.5000 mL | PREFILLED_SYRINGE | Freq: Once | INTRAMUSCULAR | Status: AC
  Administered 2020-02-29: 0.5 mL via INTRAMUSCULAR
  Filled 2020-02-29: qty 0.5

## 2020-02-29 MED ORDER — RABIES VACCINE, PCEC IM SUSR
1.0000 mL | Freq: Once | INTRAMUSCULAR | Status: AC
  Administered 2020-02-29: 06:00:00 1 mL via INTRAMUSCULAR
  Filled 2020-02-29: qty 1

## 2020-02-29 MED ORDER — RABIES IMMUNE GLOBULIN 150 UNIT/ML IM INJ
1500.0000 [IU] | INJECTION | Freq: Once | INTRAMUSCULAR | Status: AC
  Administered 2020-02-29: 06:00:00 1500 [IU]
  Filled 2020-02-29: qty 10

## 2020-02-29 NOTE — Discharge Instructions (Addendum)
Take Augmentin as prescribed until finished. Use tylenol or ibuprofen for pain control. Return on 03/03/20, 03/07/20, and 03/15/19 for additional rabies vaccines.  Return sooner for new or concerning symptoms.

## 2020-02-29 NOTE — ED Notes (Signed)
Pt wound to lower right leg cleaned; 2x2 gauze dressing applied; pt instructed to wash wound daily.

## 2020-02-29 NOTE — ED Provider Notes (Signed)
[facility] EMERGENCY DEPARTMENT Provider Note   CSN: [id number] Arrival date & time: 02/28/20  1738     History Chief Complaint  Patient presents with  . Animal Bite    Jerry Sanchez is a 44 y.o. male.  44 year old male presents to the emergency department for evaluation of dog bite.  This occurred just prior to arrival yesterday at 1830.  Patient states that he was walking when a dog, unknown to him, came up and bit him.  He is unsure of who the dog belongs to.  Bleeding of the wound controlled prior to arrival without significant intervention.  Has mild soreness at the bite site.  Cannot recall the date of his last tetanus shot.       History reviewed. No pertinent past medical history.  There are no problems to display for this patient.   History reviewed. No pertinent surgical history.     No family history on file.  Social History   Tobacco Use  . Smoking status: Never Smoker  . Smokeless tobacco: Never Used  Substance Use Topics  . Alcohol use: No  . Drug use: No    Home Medications Prior to Admission medications   Medication Sig Start Date End Date Taking? Authorizing Provider  Albuterol Sulfate (PROAIR RESPICLICK) 108 (90 Base) MCG/ACT AEPB Inhale 2 puffs into the lungs every 4 (four) hours as needed. 06/29/17   Porfirio Oar, PA  amoxicillin-clavulanate (AUGMENTIN) 875-125 MG tablet Take 1 tablet by mouth every 12 (twelve) hours. 02/29/20   Antony Madura, PA-C    Allergies    Patient has no known allergies.  Review of Systems   Review of Systems  Ten systems reviewed and are negative for acute change, except as noted in the HPI.    Physical Exam Updated Vital Signs BP 132/82   Pulse 68   Temp 98.4 F (36.9 C) (Oral)   Resp 18   Ht 6\' 2"  (1.88 m)   Wt 78.5 kg   SpO2 100%   BMI 22.21 kg/m   Physical Exam Vitals and nursing note reviewed.  Constitutional:      General: He is not in acute distress.    Appearance: He  is well-developed and well-nourished. He is not diaphoretic.     Comments: Nontoxic appearing and in NAD  HENT:     Head: Normocephalic and atraumatic.  Eyes:     General: No scleral icterus.    Extraocular Movements: EOM normal.     Conjunctiva/sclera: Conjunctivae normal.  Pulmonary:     Effort: Pulmonary effort is normal. No respiratory distress.     Comments: Respirations even and unlabored Musculoskeletal:        General: Normal range of motion.     Cervical back: Normal range of motion.     Comments: Bite wound to proximal, lateral R calf. No active bleeding. Compartments of the RLE soft. No foreign body identified or palpated.  Skin:    General: Skin is warm and dry.     Coloration: Skin is not pale.     Findings: No erythema or rash.  Neurological:     Mental Status: He is alert and oriented to person, place, and time.  Psychiatric:        Mood and Affect: Mood and affect normal.        Behavior: Behavior normal.     ED Results / Procedures / Treatments   Labs (all labs ordered are listed, but only abnormal results are  displayed) Labs Reviewed - No data to display  EKG None  Radiology No results found.  Procedures Procedures (including critical care time)  Medications Ordered in ED Medications  rabies vaccine (RABAVERT) injection 1 mL (has no administration in time range)  rabies immune globulin (HYPERAB/KEDRAB) injection 1,500 Units (has no administration in time range)  amoxicillin-clavulanate (AUGMENTIN) 875-125 MG per tablet 1 tablet (1 tablet Oral Given 02/29/20 0455)  Tdap (BOOSTRIX) injection 0.5 mL (0.5 mLs Intramuscular Given 02/29/20 0455)    ED Course  I have reviewed the triage vital signs and the nursing notes.  Pertinent labs & imaging results that were available during my care of the patient were reviewed by me and considered in my medical decision making (see chart for details).    MDM Rules/Calculators/A&P                           44 year old male presenting with dog bite to the right lateral lower extremity.  Wound cleaned, dressed.  Tetanus updated in the emergency department and patient started on Augmentin.  Given that this dog appears to have been a stray and is unable to be relocated, patient was started on rabies vaccine.  Instructed to return on day 3, 7, 14 for additional vaccinations.  Can continue with Tylenol or ibuprofen for pain control.  Discharged in stable condition.   Final Clinical Impression(s) / ED Diagnoses Final diagnoses:  Dog bite of right lower leg, initial encounter    Rx / DC Orders ED Discharge Orders         Ordered    amoxicillin-clavulanate (AUGMENTIN) 875-125 MG tablet  Every 12 hours        02/29/20 0458           Antony Madura, PA-C 02/29/20 0505    Ward, Layla Maw, DO 02/29/20 [phone]

## 2020-03-02 ENCOUNTER — Ambulatory Visit (HOSPITAL_COMMUNITY)
Admission: EM | Admit: 2020-03-02 | Discharge: 2020-03-02 | Disposition: A | Discharge status: Home / Self Care | Payer: BC Managed Care – PPO | Attending: Internal Medicine | Admitting: Internal Medicine

## 2020-03-02 DIAGNOSIS — Z203 Contact with and (suspected) exposure to rabies: Secondary | ICD-10-CM | POA: Diagnosis not present

## 2020-03-02 MED ORDER — RABIES VACCINE, PCEC IM SUSR
INTRAMUSCULAR | Status: AC
  Filled 2020-03-02: qty 1

## 2020-03-02 MED ORDER — RABIES VACCINE, PCEC IM SUSR
1.0000 mL | Freq: Once | INTRAMUSCULAR | Status: AC
  Administered 2020-03-02: 13:00:00 1 mL via INTRAMUSCULAR

## 2020-03-02 NOTE — ED Triage Notes (Signed)
Pt presents for 2nd series of rabies vaccines. Denies any pain or discomfort at wound site.

## 2020-03-07 ENCOUNTER — Ambulatory Visit (HOSPITAL_COMMUNITY)
Admission: EM | Admit: 2020-03-07 | Discharge: 2020-03-07 | Disposition: A | Discharge status: Home / Self Care | Payer: BC Managed Care – PPO | Attending: Urgent Care | Admitting: Urgent Care

## 2020-03-07 ENCOUNTER — Encounter (HOSPITAL_COMMUNITY): Payer: Self-pay

## 2020-03-07 ENCOUNTER — Other Ambulatory Visit: Payer: Self-pay

## 2020-03-07 DIAGNOSIS — Z203 Contact with and (suspected) exposure to rabies: Secondary | ICD-10-CM | POA: Diagnosis not present

## 2020-03-07 MED ORDER — RABIES VACCINE, PCEC IM SUSR
1.0000 mL | Freq: Once | INTRAMUSCULAR | Status: AC
  Administered 2020-03-07: 13:00:00 1 mL via INTRAMUSCULAR

## 2020-03-07 MED ORDER — RABIES VACCINE, PCEC IM SUSR
INTRAMUSCULAR | Status: AC
  Filled 2020-03-07: qty 1

## 2020-03-14 ENCOUNTER — Other Ambulatory Visit: Payer: Self-pay

## 2020-03-14 ENCOUNTER — Ambulatory Visit (HOSPITAL_COMMUNITY)
Admission: EM | Admit: 2020-03-14 | Discharge: 2020-03-14 | Disposition: A | Discharge status: Home / Self Care | Payer: BC Managed Care – PPO | Attending: Family Medicine | Admitting: Family Medicine

## 2020-03-14 MED ORDER — RABIES VACCINE, PCEC IM SUSR
INTRAMUSCULAR | Status: AC
  Filled 2020-03-14: qty 1

## 2020-03-14 MED ORDER — RABIES VACCINE, PCEC IM SUSR: 1.0000 mL | Freq: Once | INTRAMUSCULAR | Status: DC

## 2020-03-14 NOTE — ED Triage Notes (Signed)
Pt needs day 14 rabies vaccination. Pt denies any pain or discomfort at the wound site.

## 2020-03-27 ENCOUNTER — Other Ambulatory Visit: Payer: Self-pay

## 2020-03-27 ENCOUNTER — Ambulatory Visit (HOSPITAL_COMMUNITY)
Admission: EM | Admit: 2020-03-27 | Discharge: 2020-03-27 | Disposition: A | Discharge status: Home / Self Care | Payer: BC Managed Care – PPO | Attending: Family Medicine | Admitting: Family Medicine

## 2020-03-27 DIAGNOSIS — Z203 Contact with and (suspected) exposure to rabies: Secondary | ICD-10-CM | POA: Diagnosis not present

## 2020-03-27 MED ORDER — RABIES VACCINE, PCEC IM SUSR
1.0000 mL | Freq: Once | INTRAMUSCULAR | Status: AC
  Administered 2020-03-27: 1 mL via INTRAMUSCULAR

## 2020-03-27 MED ORDER — RABIES VACCINE, PCEC IM SUSR
INTRAMUSCULAR | Status: AC
  Filled 2020-03-27: qty 1

## 2020-03-27 NOTE — ED Triage Notes (Signed)
Patient presents for last rabies vaccination.   Patient denies any complaints.

## 2020-07-01 ENCOUNTER — Ambulatory Visit (HOSPITAL_COMMUNITY)
Admission: EM | Admit: 2020-07-01 | Discharge: 2020-07-01 | Disposition: A | Discharge status: Home / Self Care | Payer: BC Managed Care – PPO | Attending: Emergency Medicine | Admitting: Emergency Medicine

## 2020-07-01 ENCOUNTER — Encounter (HOSPITAL_COMMUNITY): Payer: Self-pay

## 2020-07-01 ENCOUNTER — Other Ambulatory Visit: Payer: Self-pay

## 2020-07-01 DIAGNOSIS — R059 Cough, unspecified: Secondary | ICD-10-CM | POA: Diagnosis not present

## 2020-07-01 DIAGNOSIS — R079 Chest pain, unspecified: Secondary | ICD-10-CM

## 2020-07-01 MED ORDER — BENZONATATE 100 MG PO CAPS: 100.0000 mg | ORAL_CAPSULE | Freq: Three times a day (TID) | ORAL | 0 refills | Status: AC

## 2020-07-01 MED ORDER — GUAIFENESIN-DM 100-10 MG/5ML PO SYRP: 5.0000 mL | ORAL_SOLUTION | ORAL | 0 refills | Status: AC | PRN

## 2020-07-01 MED ORDER — PROAIR RESPICLICK 108 (90 BASE) MCG/ACT IN AEPB: 2.0000 | INHALATION_SPRAY | RESPIRATORY_TRACT | 2 refills | Status: AC | PRN

## 2020-07-01 NOTE — Discharge Instructions (Addendum)
Can take one tessalon every 8 hours as needed for cough  Can try 5 mL of robitussin every 4 hours for cough  Can use inhaler 2 puffs every 4 hours when having trouble breathing

## 2020-07-01 NOTE — ED Provider Notes (Signed)
[facility]    CSN: [id number] Arrival date & time: 07/01/20  1516      History   Chief Complaint Chief Complaint  Patient presents with  . Cough  . Generalized Body Aches    HPI Jerry Sanchez is a 45 y.o. male.   Patient presents with nonproductive cough x1 week.  Starting yesterday cough has worsened overnight interfering persistently wheezing present with difficulty catching breath.  Endorses runny nose which she has seen improvement with over the last few.  Denies headache sore throat ear pain eye pain sinus pressure. Has not attempted treatment. Has history of asthma, requesting inhaler refill   History reviewed. No pertinent past medical history.  There are no problems to display for this patient.   History reviewed. No pertinent surgical history.     Home Medications    Prior to Admission medications   Medication Sig Start Date End Date Taking? Authorizing Provider  benzonatate (TESSALON) 100 MG capsule Take 1 capsule (100 mg total) by mouth every 8 (eight) hours. 07/01/20  Yes Fionna Merriott R, NP  guaiFENesin-dextromethorphan (ROBITUSSIN DM) 100-10 MG/5ML syrup Take 5 mLs by mouth every 4 (four) hours as needed for cough. 07/01/20  Yes Hana Trippett R, NP  Albuterol Sulfate (PROAIR RESPICLICK) 108 (90 Base) MCG/ACT AEPB Inhale 2 puffs into the lungs every 4 (four) hours as needed. 07/01/20   Valinda Hoar, NP  amoxicillin-clavulanate (AUGMENTIN) 875-125 MG tablet Take 1 tablet by mouth every 12 (twelve) hours. 02/29/20   Antony Madura, PA-C    Family History History reviewed. No pertinent family history.  Social History Social History   Tobacco Use  . Smoking status: Never Smoker  . Smokeless tobacco: Never Used  Substance Use Topics  . Alcohol use: No  . Drug use: No     Allergies   Patient has no known allergies.   Review of Systems Review of Systems  Defer to HPI  Physical Exam Triage Vital Signs ED Triage Vitals  Enc  Vitals Group     BP 07/01/20 1551 129/82     Pulse Rate 07/01/20 1551 96     Resp 07/01/20 1551 16     Temp 07/01/20 1551 98.9 F (37.2 C)     Temp Source 07/01/20 1551 Oral     SpO2 07/01/20 1551 98 %     Weight --      Height --      Head Circumference --      Peak Flow --      Pain Score 07/01/20 1550 0     Pain Loc --      Pain Edu? --      Excl. in GC? --    No data found.  Updated Vital Signs BP 129/82 (BP Location: Right Arm)   Pulse 96   Temp 98.9 F (37.2 C) (Oral)   Resp 16   SpO2 98%   Visual Acuity Right Eye Distance:   Left Eye Distance:   Bilateral Distance:    Right Eye Near:   Left Eye Near:    Bilateral Near:     Physical Exam Constitutional:      Appearance: Normal appearance. He is normal weight.  HENT:     Head: Normocephalic.     Right Ear: Tympanic membrane, ear canal and external ear normal.     Left Ear: Tympanic membrane, ear canal and external ear normal.     Nose: Nose normal.     Mouth/Throat:  Mouth: Mucous membranes are moist.     Pharynx: Oropharynx is clear.  Eyes:     Extraocular Movements: Extraocular movements intact.     Conjunctiva/sclera: Conjunctivae normal.     Pupils: Pupils are equal, round, and reactive to light.  Cardiovascular:     Rate and Rhythm: Normal rate and regular rhythm.     Pulses: Normal pulses.     Heart sounds: Normal heart sounds.  Pulmonary:     Effort: Pulmonary effort is normal.     Breath sounds: Normal breath sounds.  Musculoskeletal:        General: Normal range of motion.     Cervical back: Normal range of motion.  Skin:    General: Skin is warm and dry.  Neurological:     Mental Status: He is alert and oriented to person, place, and time. Mental status is at baseline.  Psychiatric:        Mood and Affect: Mood normal.        Behavior: Behavior normal.        Thought Content: Thought content normal.        Judgment: Judgment normal.      UC Treatments / Results  Labs (all  labs ordered are listed, but only abnormal results are displayed) Labs Reviewed - No data to display  EKG   Radiology No results found.  Procedures Procedures (including critical care time)  Medications Ordered in UC Medications - No data to display  Initial Impression / Assessment and Plan / UC Course  I have reviewed the triage vital signs and the nursing notes.  Pertinent labs & imaging results that were available during my care of the patient were reviewed by me and considered in my medical decision making (see chart for details).  Cough  1. Tessalon 100 mg q8 prn 2. Robitussin Dm 5 mL every 4 hours prn 3. Albuterol inhaler 2 puffs every 4 hours prn Final Clinical Impressions(s) / UC Diagnoses   Final diagnoses:  Cough     Discharge Instructions     Can take one tessalon every 8 hours as needed for cough  Can try 5 mL of robitussin every 4 hours for cough  Can use inhaler 2 puffs every 4 hours when having trouble breathing    ED Prescriptions    Medication Sig Dispense Auth. Provider   Albuterol Sulfate (PROAIR RESPICLICK) 108 (90 Base) MCG/ACT AEPB Inhale 2 puffs into the lungs every 4 (four) hours as needed. 1 each Valinda Hoar, NP   benzonatate (TESSALON) 100 MG capsule Take 1 capsule (100 mg total) by mouth every 8 (eight) hours. 21 capsule Shelsey Rieth R, NP   guaiFENesin-dextromethorphan (ROBITUSSIN DM) 100-10 MG/5ML syrup Take 5 mLs by mouth every 4 (four) hours as needed for cough. 118 mL Minnie Shi, Elita Boone, NP     PDMP not reviewed this encounter.   Valinda Hoar, NP 07/01/20 1620

## 2020-07-01 NOTE — ED Triage Notes (Signed)
Patient presents to Urgent Care with complaints of cough and generalized body aches x 2 days. He reports initial symptoms was a runny nose a week ago.  Pt states he needs a refill for his albuterol inhaler.   Denies fever.
